# Patient Record
Sex: Male | Born: 1985 | Race: White | Hispanic: Yes | Marital: Single
Health system: Southern US, Community
[De-identification: ages and names within clinical notes are randomized; demographics above are authoritative.]

---

## 2021-10-23 ENCOUNTER — Emergency Department (HOSPITAL_COMMUNITY)
Admission: EM | Admit: 2021-10-23 | Discharge: 2021-10-24 | Disposition: A | Discharge status: Home / Self Care | Payer: Self-pay | Attending: Emergency Medicine | Admitting: Emergency Medicine

## 2021-10-23 ENCOUNTER — Other Ambulatory Visit: Payer: Self-pay

## 2021-10-23 ENCOUNTER — Emergency Department (HOSPITAL_COMMUNITY): Payer: Self-pay

## 2021-10-23 DIAGNOSIS — M25552 Pain in left hip: Secondary | ICD-10-CM | POA: Insufficient documentation

## 2021-10-23 DIAGNOSIS — M79605 Pain in left leg: Secondary | ICD-10-CM | POA: Insufficient documentation

## 2021-10-23 DIAGNOSIS — M5432 Sciatica, left side: Secondary | ICD-10-CM | POA: Insufficient documentation

## 2021-10-23 MED ORDER — HYDROCODONE-ACETAMINOPHEN 5-325 MG PO TABS
1.0000 | ORAL_TABLET | Freq: Once | ORAL | Status: AC
  Administered 2021-10-23: 1 via ORAL
  Filled 2021-10-23: qty 1

## 2021-10-23 NOTE — ED Triage Notes (Signed)
Pt here for L hip pain that radiates down leg x5 days, pt denies injury to leg. No hx of such. Hx of HTN, 158/96, 90HR, 99% RA.

## 2021-10-23 NOTE — ED Provider Triage Note (Signed)
Emergency Medicine Provider Triage Evaluation Note  Ronnie Morgan , a 36 y.o. male  was evaluated in triage.  Pt complains of left leg pain.  Has been present for about 5 days.  Starts in his buttock and extends down the back of his leg.  Pain is constant.  He has had pain like this before, but never this severe.  He has tried multiple over-the-counter medicines without improvement.  Review of Systems  Positive: Leg pain Negative: fever  Physical Exam  There were no vitals taken for this visit. Gen:   Awake, no distress   Resp:  Normal effort  MSK:   Tenderness to palpation of the left buttock and low back. No saddle anesthesia    Medical Decision Making  Medically screening exam initiated at 11:07 PM.  Appropriate orders placed.  Naethan Jose Andrena Mews Rizo was informed that the remainder of the evaluation will be completed by another provider, this initial triage assessment does not replace that evaluation, and the importance of remaining in the ED until their evaluation is complete.  Xray, meds   Alveria Apley, PA-C 10/23/21 2315

## 2021-10-24 MED ORDER — KETOROLAC TROMETHAMINE 30 MG/ML IJ SOLN
15.0000 mg | Freq: Once | INTRAMUSCULAR | Status: AC
Start: 2021-10-24 — End: 2021-10-24
  Administered 2021-10-24: 15 mg via INTRAMUSCULAR
  Filled 2021-10-24: qty 1

## 2021-10-24 MED ORDER — METHOCARBAMOL 500 MG PO TABS
500.0000 mg | ORAL_TABLET | Freq: Once | ORAL | Status: AC
  Administered 2021-10-24: 500 mg via ORAL
  Filled 2021-10-24: qty 1

## 2021-10-24 MED ORDER — DEXAMETHASONE SODIUM PHOSPHATE 10 MG/ML IJ SOLN
10.0000 mg | Freq: Once | INTRAMUSCULAR | Status: AC
  Administered 2021-10-24: 10 mg via INTRAMUSCULAR
  Filled 2021-10-24: qty 1

## 2021-10-24 MED ORDER — METHOCARBAMOL 500 MG PO TABS: 500.0000 mg | ORAL_TABLET | Freq: Two times a day (BID) | ORAL | 0 refills | Status: AC

## 2021-10-24 MED ORDER — PREDNISONE 10 MG (21) PO TBPK: ORAL_TABLET | Freq: Every day | ORAL | 0 refills | Status: AC

## 2021-10-24 MED ORDER — LIDOCAINE 5 % EX PTCH: 1.0000 | MEDICATED_PATCH | CUTANEOUS | 0 refills | Status: AC

## 2021-10-24 MED ORDER — OXYCODONE-ACETAMINOPHEN 5-325 MG PO TABS
1.0000 | ORAL_TABLET | Freq: Once | ORAL | Status: AC
Start: 2021-10-24 — End: 2021-10-24
  Administered 2021-10-24: 1 via ORAL
  Filled 2021-10-24: qty 1

## 2021-10-24 MED ORDER — LIDOCAINE 5 % EX PTCH
1.0000 | MEDICATED_PATCH | CUTANEOUS | Status: DC
  Administered 2021-10-24: 1 via TRANSDERMAL
  Filled 2021-10-24: qty 1

## 2021-10-24 MED ORDER — HYDROCODONE-ACETAMINOPHEN 5-325 MG PO TABS: 1.0000 | ORAL_TABLET | Freq: Four times a day (QID) | ORAL | 0 refills | Status: AC | PRN

## 2021-10-24 NOTE — Discharge Instructions (Signed)
Toma prednisone por la inflamacion y dolor del nervio Toma robaxin para [employer] los musculos.  [place] la parache de lidociane por dolor Toma norco si tiene dolor muy fuerte. Ten cuidado, puede causar cansado. No conduce.  Da [name] cita con el doctor debajor si el dolor no mejora [place] hielo o una bolsa de color por dolor Haga las estiras (en los papeles) por dolor.  No lleva cosas pesadas Regresa a la sala de emergencia si tiene fiebre, si no puede caminar, o con nuevos sintomas.

## 2021-10-24 NOTE — ED Provider Notes (Signed)
[facility] EMERGENCY DEPARTMENT Provider Note   CSN: [id number] Arrival date & time: 10/23/21  2304     History  Chief Complaint  Patient presents with   Hip Pain    Ronnie Morgan is a 36 y.o. male left leg pain.  Patient states for about 5 days he has had pain beginning in his left low back radiating down his leg.  Pain worsened today.  He has tried over-the-counter medications without improvement of symptoms.  No fall, trauma, or injury.  He has had pain before, but never to this extent.  No pain on the right side.  No fevers, chills.  No nausea, vomiting, abdominal pain.  No urinary symptoms.   HPI     Home Medications Prior to Admission medications   Medication Sig Start Date End Date Taking? Authorizing Provider  HYDROcodone-acetaminophen (NORCO/VICODIN) 5-325 MG tablet Take 1 tablet by mouth every 6 (six) hours as needed. 10/24/21  Yes Zipporah Finamore, PA-C  lidocaine (LIDODERM) 5 % Place 1 patch onto the skin daily. Remove & Discard patch within 12 hours or as directed by MD 10/24/21  Yes Saide Lanuza, PA-C  methocarbamol (ROBAXIN) 500 MG tablet Take 1 tablet (500 mg total) by mouth 2 (two) times daily. 10/24/21  Yes Glenford Garis, PA-C  predniSONE (STERAPRED UNI-PAK 21 TAB) 10 MG (21) TBPK tablet Take by mouth daily. Take 6 tabs by mouth daily  for 2 days, then 5 tabs for 2 days, then 4 tabs for 2 days, then 3 tabs for 2 days, 2 tabs for 2 days, then 1 tab by mouth daily for 2 days 10/24/21  Yes Daaiel Starlin, PA-C      Allergies    Patient has no known allergies.    Review of Systems   Review of Systems  Musculoskeletal:  Positive for back pain.   Physical Exam Updated Vital Signs BP (!) 143/96 (BP Location: Right Arm)    Pulse 74    Temp 99.2 F (37.3 C) (Oral)    Resp 20    SpO2 100%  Physical Exam Vitals and nursing note reviewed.  Constitutional:      General: He is not in acute distress.    Appearance: Normal  appearance.     Comments: nontoxic  HENT:     Head: Normocephalic and atraumatic.  Eyes:     Conjunctiva/sclera: Conjunctivae normal.     Pupils: Pupils are equal, round, and reactive to light.  Cardiovascular:     Rate and Rhythm: Normal rate and regular rhythm.     Pulses: Normal pulses.  Pulmonary:     Effort: Pulmonary effort is normal. No respiratory distress.     Breath sounds: Normal breath sounds. No wheezing.     Comments: Speaking in full sentences.  Clear lung sounds in all fields. Abdominal:     General: There is no distension.     Palpations: Abdomen is soft.     Tenderness: There is no abdominal tenderness.  Musculoskeletal:        General: Tenderness present. Normal range of motion.     Cervical back: Normal range of motion and neck supple.     Comments: Tenderness palpation of left low back musculature and over the left buttock.  No pain over midline spine.  No step-offs or deformities. No saddle anesthesia Antalgic gait.  Skin:    General: Skin is warm and dry.     Capillary Refill: Capillary refill takes less than 2 seconds.  Neurological:     Mental Status: He is alert and oriented to person, place, and time.  Psychiatric:        Mood and Affect: Mood and affect normal.        Speech: Speech normal.        Behavior: Behavior normal.    ED Results / Procedures / Treatments   Labs (all labs ordered are listed, but only abnormal results are displayed) Labs Reviewed - No data to display  EKG None  Radiology DG Lumbar Spine Complete  Result Date: 10/23/2021 CLINICAL DATA:  Left lower extremity radiculopathy, pain EXAM: LUMBAR SPINE - COMPLETE 4+ VIEW COMPARISON:  None. FINDINGS: Frontal, bilateral oblique, lateral views of the lumbar spine are obtained. There are 5 non-rib-bearing lumbar type vertebral bodies in anatomic alignment. No acute displaced fracture. Disc spaces are well preserved. Sacroiliac joints are unremarkable. IMPRESSION: 1. Unremarkable  lumbar spine. Electronically Signed   By: Sharlet SalinaMichael  Brown M.D.   On: 10/23/2021 23:58    Procedures Procedures    Medications Ordered in ED Medications  lidocaine (LIDODERM) 5 % 1 patch (1 patch Transdermal Patch Applied 10/24/21 0448)  HYDROcodone-acetaminophen (NORCO/VICODIN) 5-325 MG per tablet 1 tablet (1 tablet Oral Given 10/23/21 2316)  ketorolac (TORADOL) 30 MG/ML injection 15 mg (15 mg Intramuscular Given 10/24/21 0448)  dexamethasone (DECADRON) injection 10 mg (10 mg Intramuscular Given 10/24/21 0429)  methocarbamol (ROBAXIN) tablet 500 mg (500 mg Oral Given 10/24/21 0429)  oxyCODONE-acetaminophen (PERCOCET/ROXICET) 5-325 MG per tablet 1 tablet (1 tablet Oral Given 10/24/21 0542)    ED Course/ Medical Decision Making/ A&P                           Medical Decision Making   This patient presents to the ED for concern of L low back and leg pain. This involves a number of treatment options, and is a complaint that carries with it a moderate risk of complications and morbidity.  The differential diagnosis includes sciatica, muscle spasm, back injury, degenerative disease.   Imaging Studies:  I ordered imaging studies including lumbar spine xray I independently visualized and interpreted imaging which showed no acute findings such as fx I agree with the radiologist interpretation   Medicines ordered:  I ordered medication for pain.  On reevaluation of the patient after these medicines, patient improved   Test Considered:  As patient is without acute neurologic deficit and does not have any red flag signs for back pain, I do not feel he needs emergent MRI  Disposition:  After consideration of the diagnostic results and the patients response to treatment, I feel that the patent would benefit from outpatient management.  Discussed findings with patient.  Discussed continued symptomatic treatment, this will take several days to resolve.  Discussed importance of movement, however  he should not do any heavy lifting.  Discussed follow-up with orthopedics if symptoms not improving.  At this time, patient appears safe for discharge.  Return precautions given.  Patient states he understands and agrees to plan   Final Clinical Impression(s) / ED Diagnoses Final diagnoses:  Sciatica of left side    Rx / DC Orders ED Discharge Orders          Ordered    predniSONE (STERAPRED UNI-PAK 21 TAB) 10 MG (21) TBPK tablet  Daily        10/24/21 0530    methocarbamol (ROBAXIN) 500 MG tablet  2 times daily  10/24/21 0530    HYDROcodone-acetaminophen (NORCO/VICODIN) 5-325 MG tablet  Every 6 hours PRN        10/24/21 0530    lidocaine (LIDODERM) 5 %  Every 24 hours        10/24/21 0530              Alveria Apley, PA-C 10/24/21 0631    Sabas Sous, MD 10/24/21 [phone]

## 2021-11-03 ENCOUNTER — Telehealth (HOSPITAL_COMMUNITY): Payer: Self-pay | Admitting: Emergency Medicine

## 2021-11-03 NOTE — Telephone Encounter (Signed)
Pt wanted work note

## 2023-01-09 IMAGING — CR DG LUMBAR SPINE COMPLETE 4+V
5 series · 5 of 5 positions shown · non-contrast
Comparison: None.

CLINICAL DATA: Left lower extremity radiculopathy, pain

EXAM:
LUMBAR SPINE - COMPLETE 4+ VIEW

[l-spine ap]
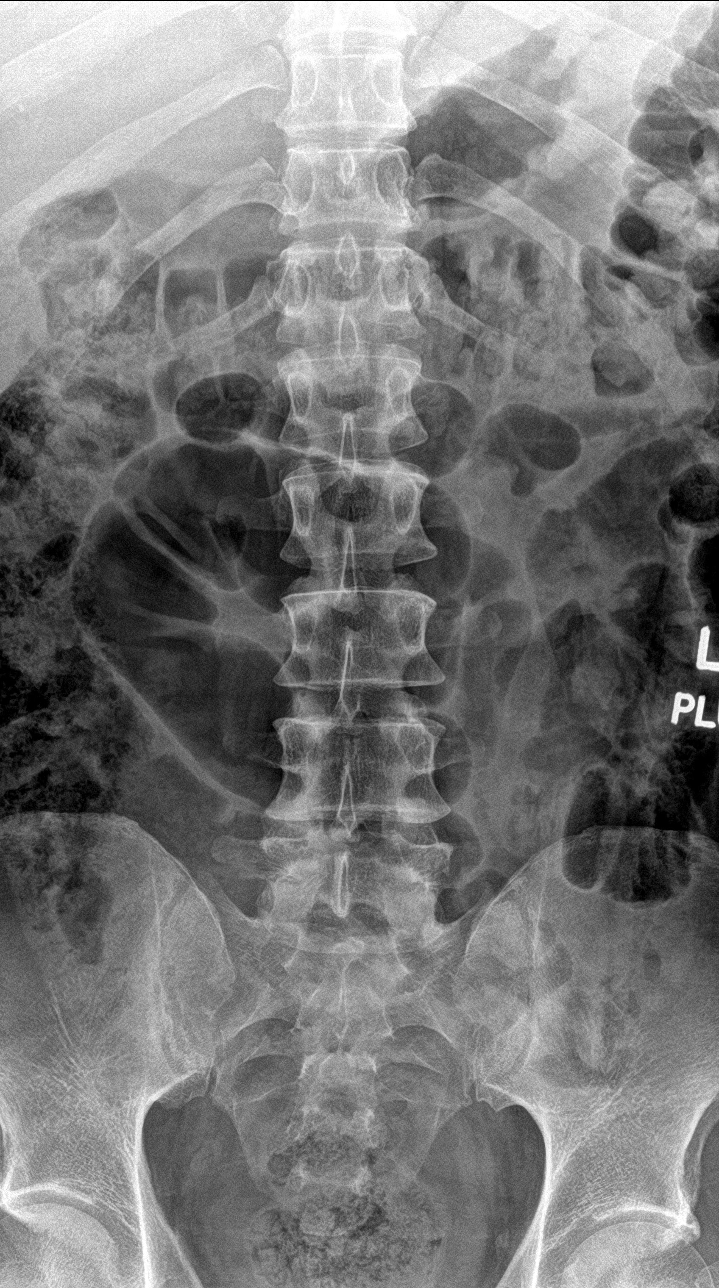

[l-spine obl (1 of 2)]
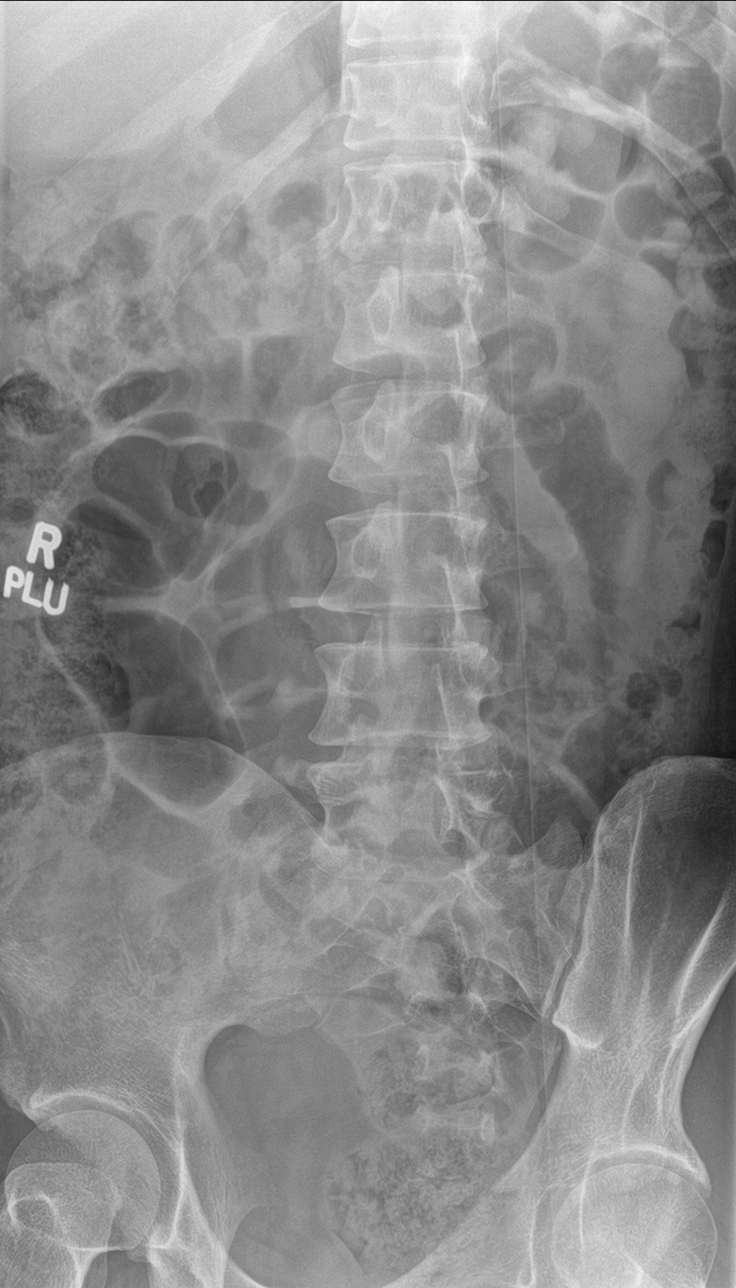

[l-spine obl (2 of 2)]
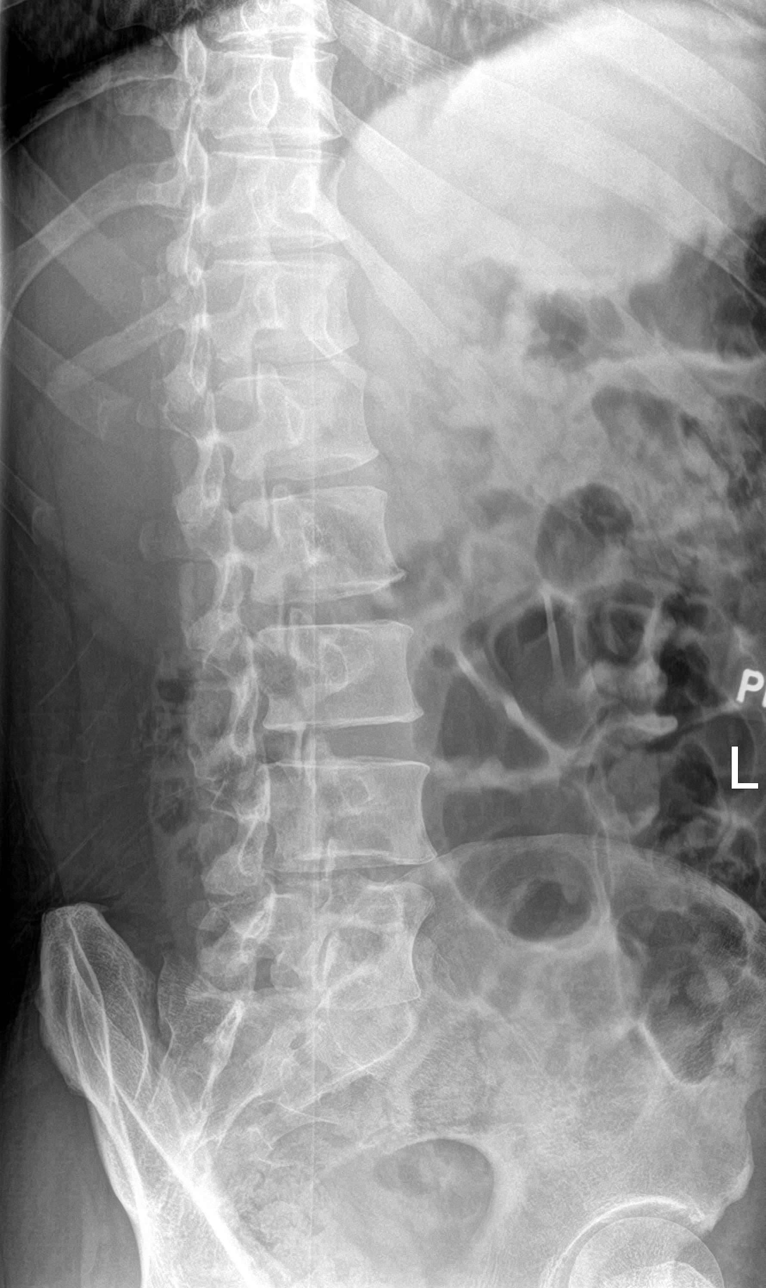

[l-spine lat]
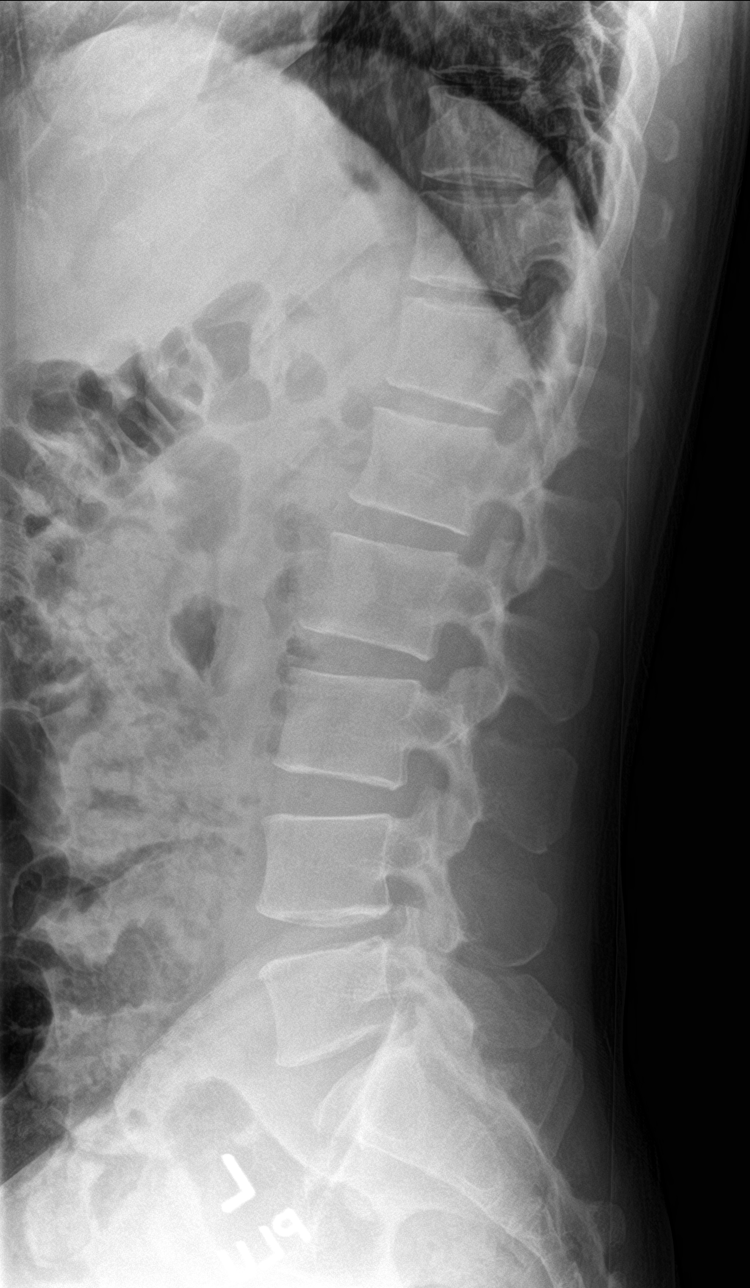

[l-spine spot]
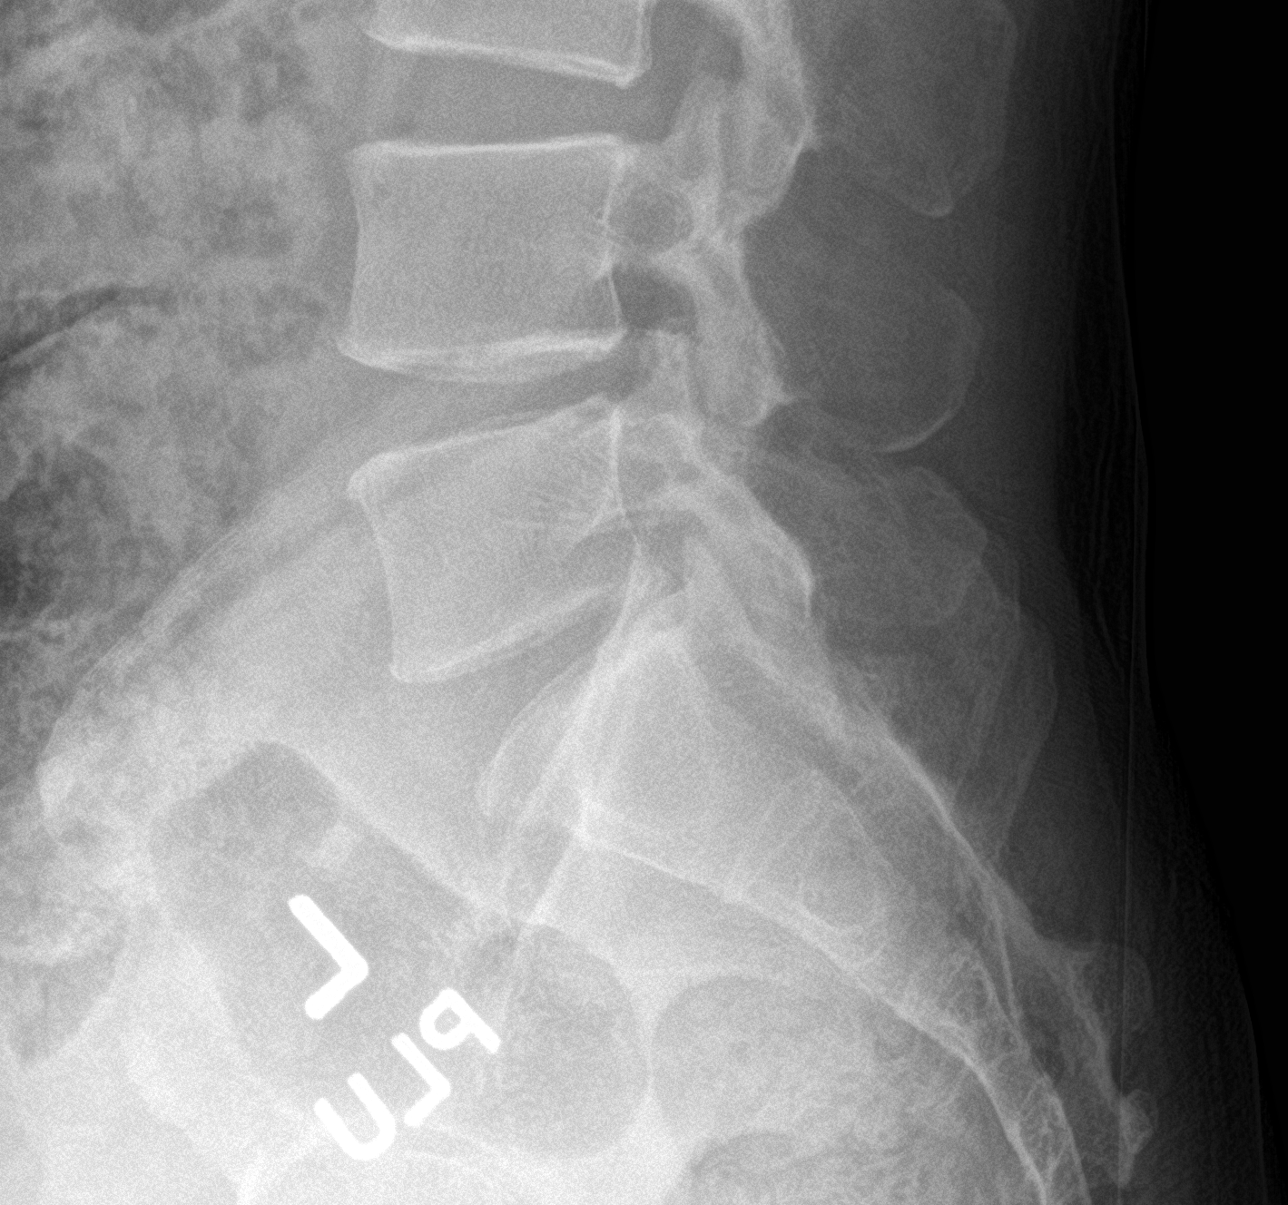

[5 of 5 positions shown; findings below may reference images not displayed]

FINDINGS: Frontal, bilateral oblique, lateral views of the lumbar spine are
obtained. There are 5 non-rib-bearing lumbar type vertebral bodies
in anatomic alignment. No acute displaced fracture. Disc spaces are
well preserved. Sacroiliac joints are unremarkable.
IMPRESSION: 1. Unremarkable lumbar spine.
